# Patient Record
Sex: Male | Born: 1970 | Race: White | Hispanic: No | Marital: Married
Health system: Southern US, Community
[De-identification: ages and names within clinical notes are randomized; demographics above are authoritative.]

---

## 2019-11-07 ENCOUNTER — Emergency Department (HOSPITAL_COMMUNITY): Payer: Self-pay

## 2019-11-07 ENCOUNTER — Emergency Department (HOSPITAL_COMMUNITY)
Admission: EM | Admit: 2019-11-07 | Discharge: 2019-11-07 | Disposition: A | Discharge status: Home / Self Care | Payer: Self-pay | Attending: Emergency Medicine | Admitting: Emergency Medicine

## 2019-11-07 ENCOUNTER — Encounter (HOSPITAL_COMMUNITY): Payer: Self-pay | Admitting: Emergency Medicine

## 2019-11-07 DIAGNOSIS — Y9389 Activity, other specified: Secondary | ICD-10-CM | POA: Insufficient documentation

## 2019-11-07 DIAGNOSIS — S62326A Displaced fracture of shaft of fifth metacarpal bone, right hand, initial encounter for closed fracture: Secondary | ICD-10-CM | POA: Insufficient documentation

## 2019-11-07 DIAGNOSIS — W2209XA Striking against other stationary object, initial encounter: Secondary | ICD-10-CM | POA: Insufficient documentation

## 2019-11-07 DIAGNOSIS — R52 Pain, unspecified: Secondary | ICD-10-CM

## 2019-11-07 DIAGNOSIS — Y998 Other external cause status: Secondary | ICD-10-CM | POA: Insufficient documentation

## 2019-11-07 DIAGNOSIS — Y9289 Other specified places as the place of occurrence of the external cause: Secondary | ICD-10-CM | POA: Insufficient documentation

## 2019-11-07 NOTE — Discharge Instructions (Addendum)
Keep splint clean and dry, follow up with orthopedics, call to schedule an appointment. Take Motrin and Tylenol as needed as directed for pain.  You can apply an ice pack over the splint for 20 minutes at a time to help with pain and swelling.

## 2019-11-07 NOTE — Progress Notes (Signed)
Orthopedic Tech Progress Note Patient Details:  Connor Bennett. 01-27-1971 [id number]  Ortho Devices Type of Ortho Device: Ulna gutter splint Ortho Device/Splint Location: URE Ortho Device/Splint Interventions: Ordered, Application   Post Interventions Patient Tolerated: Well Instructions Provided: Care of device   [name] A [name] 11/07/2019, 1:34 PM

## 2019-11-07 NOTE — ED Notes (Signed)
Patient verbalizes understanding of discharge instructions. Opportunity for questioning and answers were provided. Armband removed by staff, pt discharged from ED ambulatory.   

## 2019-11-07 NOTE — ED Provider Notes (Signed)
[name] EMERGENCY DEPARTMENT Provider Note   CSN: [id number] Arrival date & time: 11/07/19  1002     History Chief Complaint  Patient presents with  . Hand Injury    Connor Bennett. is a 49 y.o. male.  49yo male with complaint of right hand injury, punched a refrigerator 1 week ago. Patient is right hand dominant, has been wearing a velcro TKO splint, swelling has improved but patient continues to have pain along the 4th and 5th metacarpals. Skin intact, no other injuries or complaints.         History reviewed. No pertinent past medical history.  There are no problems to display for this patient.   History reviewed. No pertinent surgical history.     No family history on file.  Social History   Tobacco Use  . Smoking status: Not on file  Substance Use Topics  . Alcohol use: Not on file  . Drug use: Not on file    Home Medications Prior to Admission medications   Not on File    Allergies    Patient has no known allergies.  Review of Systems   Review of Systems  Constitutional: Negative for fever.  Musculoskeletal: Positive for arthralgias, joint swelling and myalgias.  Skin: Negative for color change, rash and wound.  Allergic/Immunologic: Negative for immunocompromised state.  Neurological: Negative for weakness and numbness.    Physical Exam Updated Vital Signs BP (!) 144/98   Pulse 70   Temp 98.2 F (36.8 C) (Oral)   Resp 18   Ht 6\' 2"  (1.88 m)   Wt 99.8 kg   SpO2 95%   BMI 28.25 kg/m   Physical Exam Vitals and nursing note reviewed.  Constitutional:      Appearance: Normal appearance.  HENT:     Head: Normocephalic and atraumatic.  Cardiovascular:     Pulses: Normal pulses.  Musculoskeletal:        General: Swelling and tenderness present.       Arms:  Skin:    General: Skin is warm and dry.     Capillary Refill: Capillary refill takes less than 2 seconds.     Findings: No erythema or rash.  Neurological:      Mental Status: He is alert.     Sensory: No sensory deficit.     ED Results / Procedures / Treatments   Labs (all labs ordered are listed, but only abnormal results are displayed) Labs Reviewed - No data to display  EKG None  Radiology DG Hand Complete Right  Result Date: 11/07/2019 CLINICAL DATA:  Punched diary for drain or last week with RIGHT hand, pain at last 2 digits with deformity to fifth knuckle EXAM: RIGHT HAND - COMPLETE 3+ VIEW COMPARISON:  None FINDINGS: Osseous mineralization normal. Joint spaces preserved. Oblique displaced distal RIGHT fifth metacarpal fracture with apex ulnar and dorsal angulation. No additional fracture, dislocation, or bone destruction. IMPRESSION: Displaced and angulated distal RIGHT fifth metacarpal fracture. Electronically Signed   By: 01/07/2020 M.D.   On: 11/07/2019 10:36    Procedures Procedures (including critical care time)  Medications Ordered in ED Medications - No data to display  ED Course  I have reviewed the triage vital signs and the nursing notes.  Pertinent labs & imaging results that were available during my care of the patient were reviewed by me and considered in my medical decision making (see chart for details).  Clinical Course as of Nov 07 1250  Fri Nov 07, 2019  [id number] 49 year old male, right-hand-dominant, right hand injury after punching a refrigerator 1 week ago.  Patient has pain along the fifth metacarpal as well as at the fourth MCP.  Sensation intact, brisk capillary refill.  Patient has splinted his own hand with a Velcro TKO splint.  X-ray shows displaced fifth metacarpal fracture.  Case discussed with Dr. Melvyn Novas with hand surgery, patient can follow-up outpatient.  Patient placed in ulnar gutter splint, recommend Motrin Tylenol and apply ice as needed.   [LM]    Clinical Course User Index [LM] Alden Hipp   MDM Rules/Calculators/A&P                         SPLINT APPLICATION Date/Time: 12:52  PM Authorized by: Jeannie Fend Consent: Verbal consent obtained. Risks and benefits: risks, benefits and alternatives were discussed Consent given by: patient Splint applied by: orthopedic technician Location details: right hand Splint type: ulna gutter Supplies used: fiberglass OCL, ace Post-procedure: The splinted body part was neurovascularly unchanged following the procedure. Patient tolerance: Patient tolerated the procedure well with no immediate complications.    Final Clinical Impression(s) / ED Diagnoses Final diagnoses:  Closed displaced fracture of shaft of fifth metacarpal bone of right hand, initial encounter    Rx / DC Orders ED Discharge Orders    None       Alden Hipp 11/07/19 1252    Sabino Donovan, MD 11/08/19 [phone]

## 2019-11-07 NOTE — ED Triage Notes (Addendum)
Pt report punching a fridge last week with R hand, pain to last two digits and deformity to R 5th knuckle.

## 2021-04-17 IMAGING — DX DG HAND COMPLETE 3+V*R*
3 series · 3 of 3 positions shown · non-contrast
Comparison: None

CLINICAL DATA: Punched diary for drain or last week with RIGHT
hand, pain at last 2 digits with deformity to fifth knuckle

EXAM:
RIGHT HAND - COMPLETE 3+ VIEW

[x hand pa right]
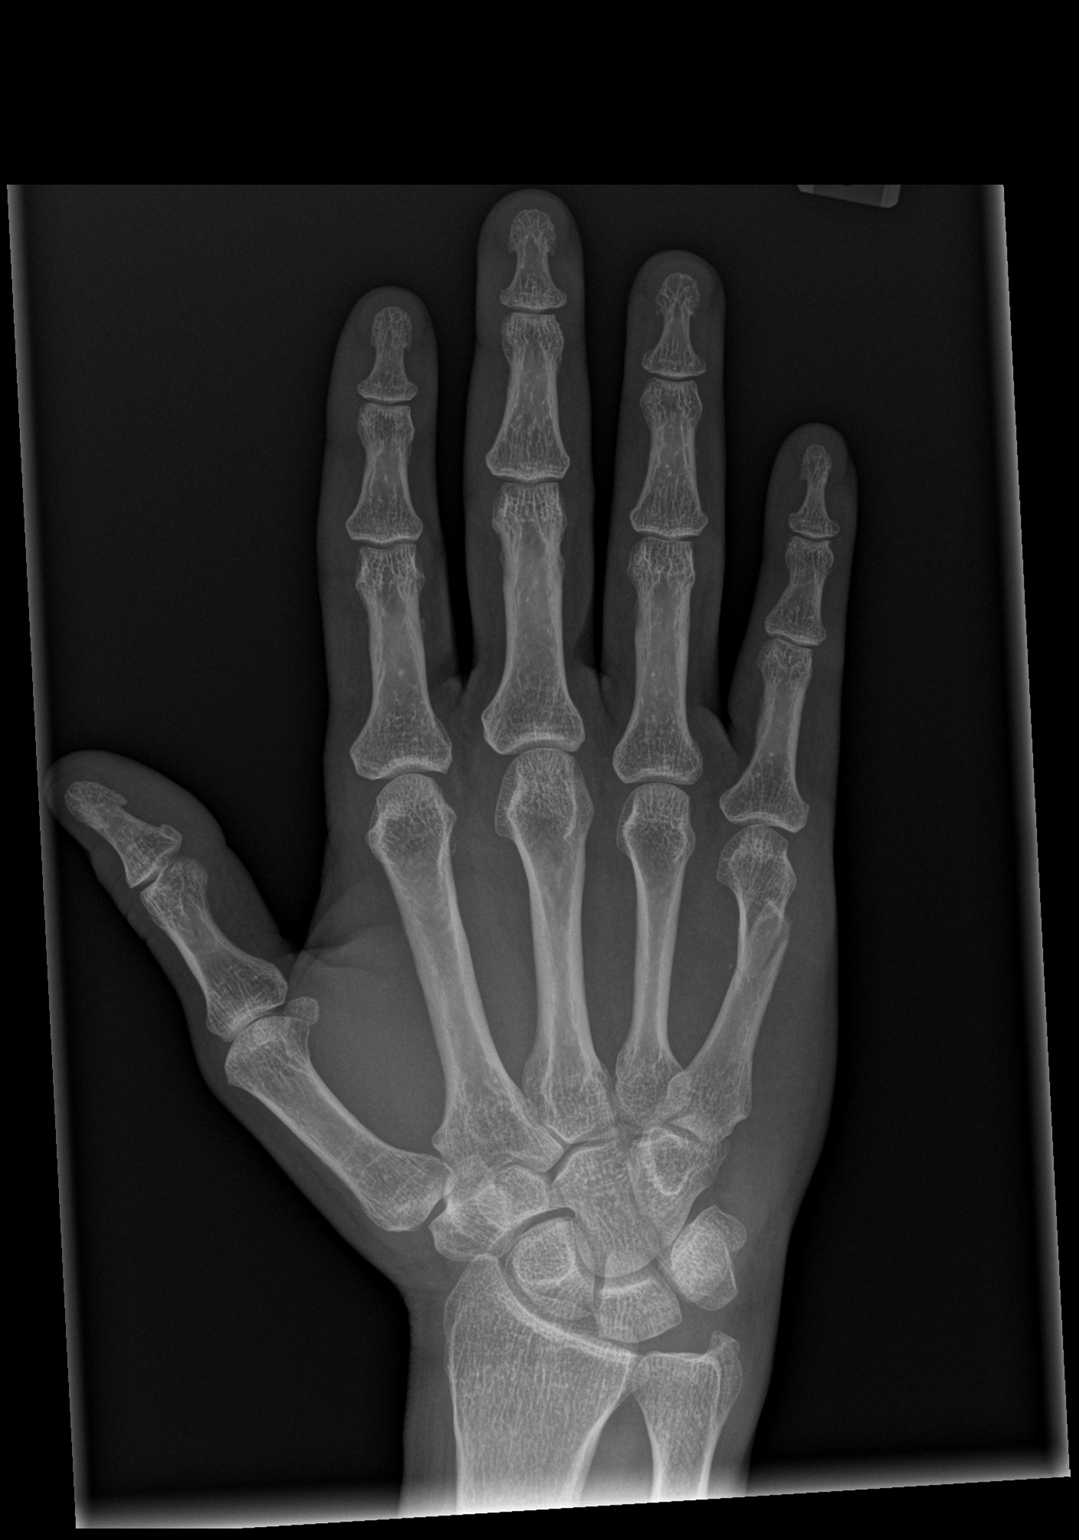

[x hand obl right]
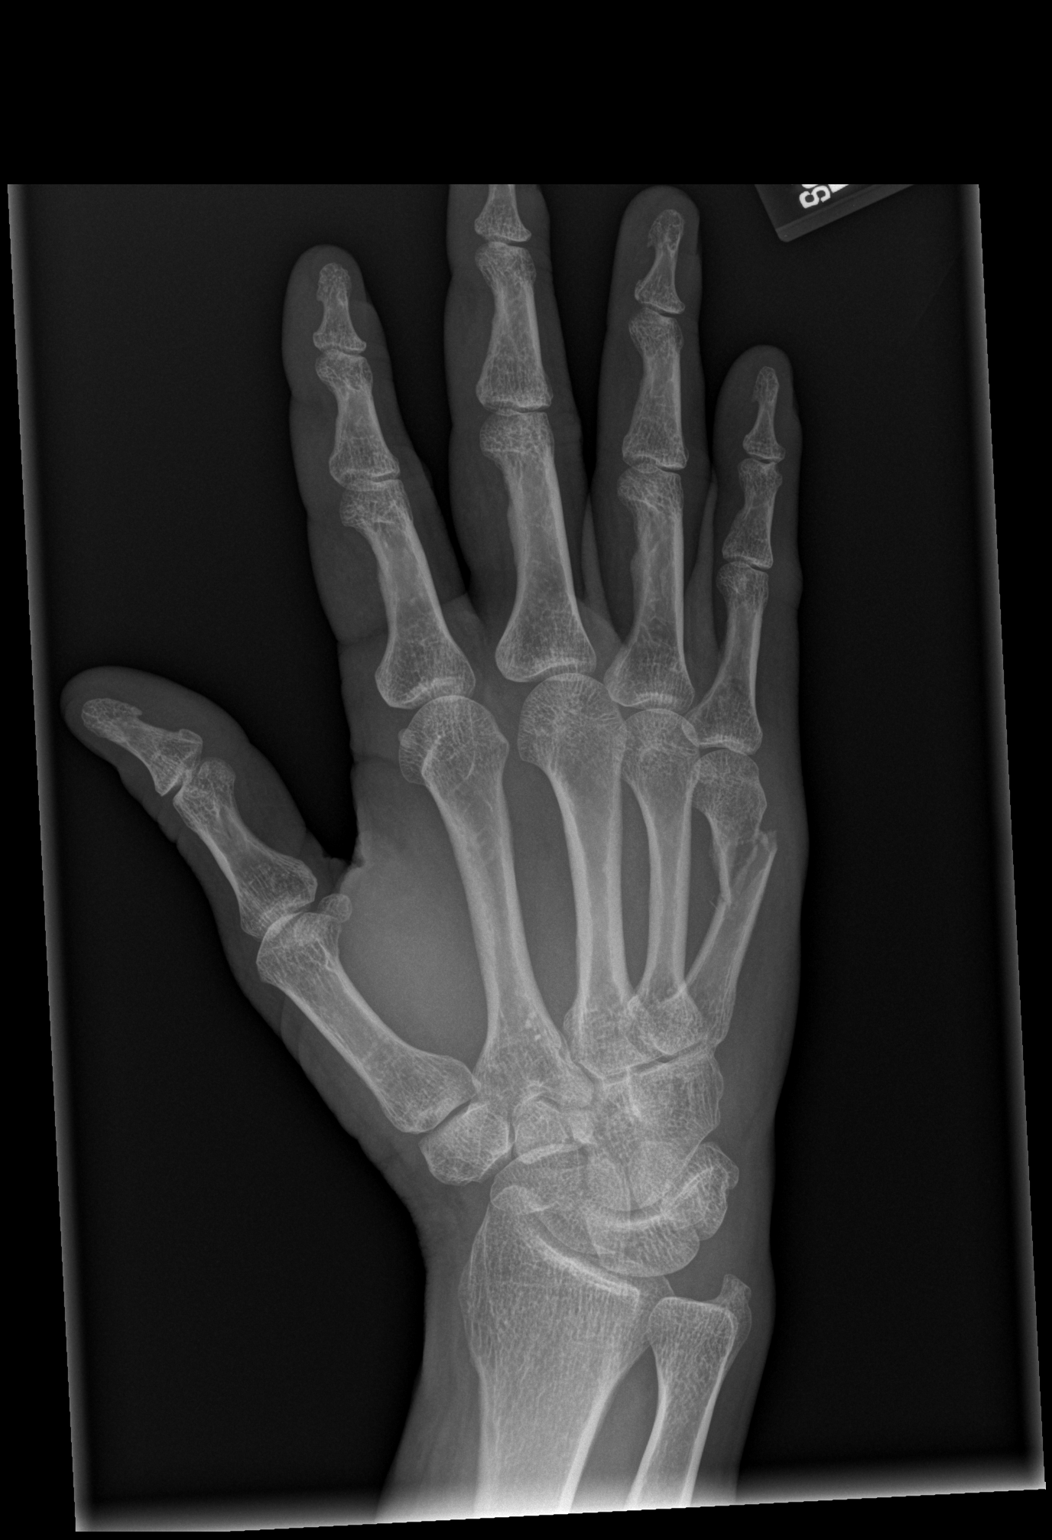

[x hand lat right]
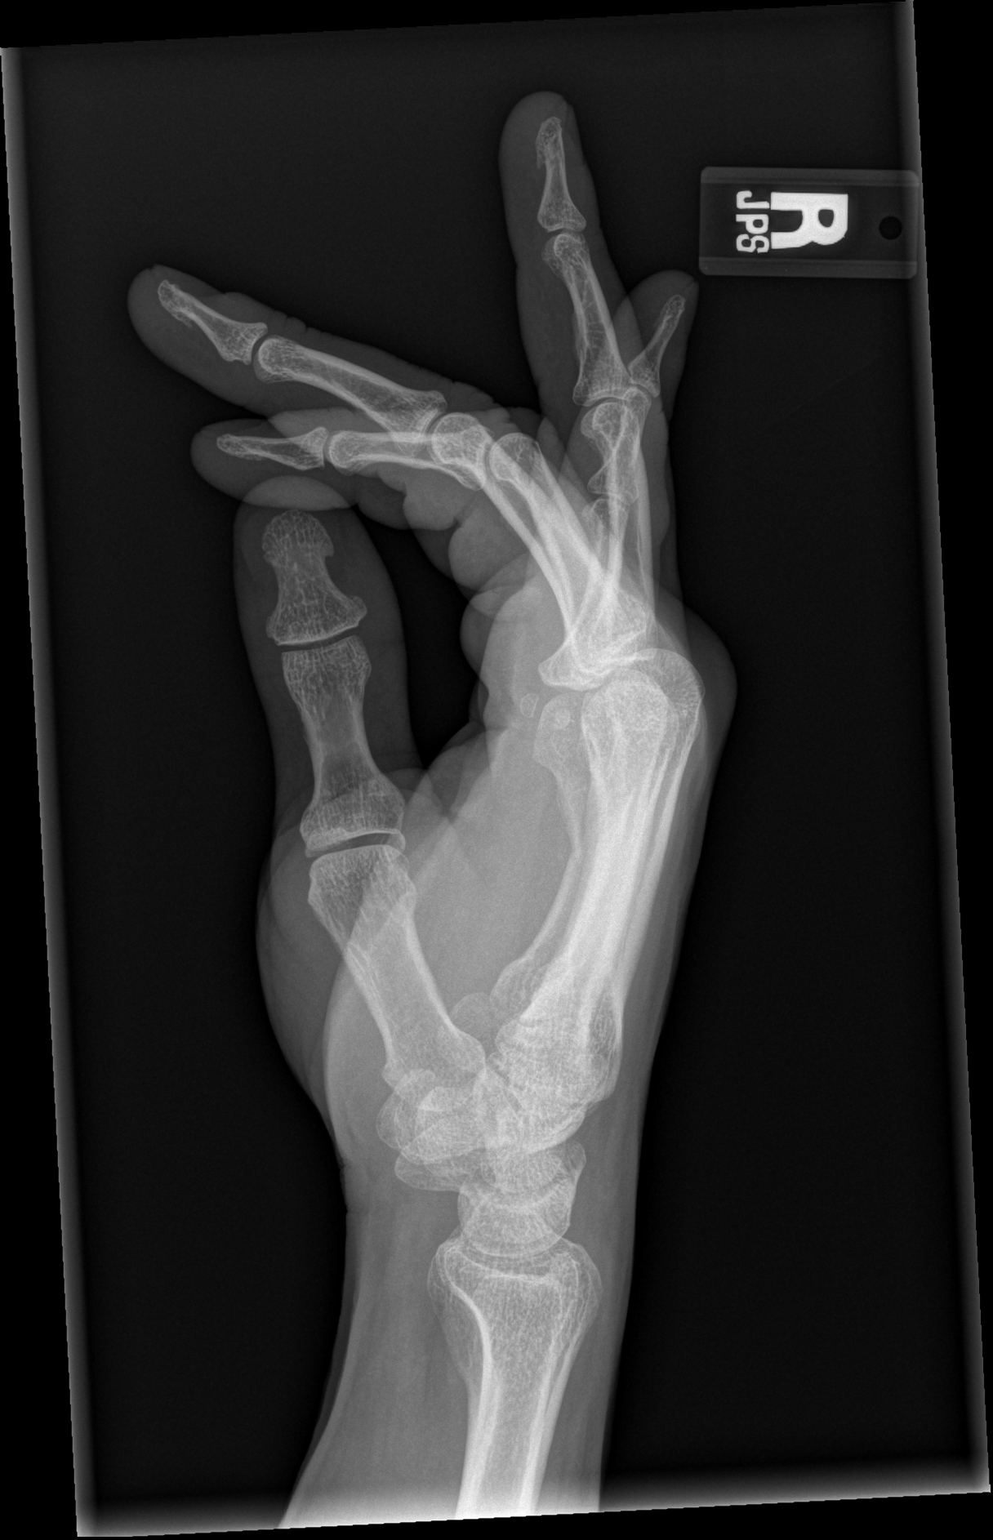

[3 of 3 positions shown; findings below may reference images not displayed]

FINDINGS: Osseous mineralization normal.

Joint spaces preserved.

Oblique displaced distal RIGHT fifth metacarpal fracture with apex
ulnar and dorsal angulation.

No additional fracture, dislocation, or bone destruction.
IMPRESSION: Displaced and angulated distal RIGHT fifth metacarpal fracture.
